# Patient Record
Sex: Male | Born: 1990 | Hispanic: Yes | Marital: Single | Smoking: Current every day smoker
Health system: Southern US, Community
[De-identification: ages and names within clinical notes are randomized; demographics above are authoritative.]

---
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
## 2010-08-16 ENCOUNTER — Emergency Department: Payer: Self-pay | Admitting: Emergency Medicine

## 2010-09-18 ENCOUNTER — Emergency Department: Payer: Self-pay | Admitting: Emergency Medicine

## 2011-03-04 LAB — LIPID PANEL
Cholesterol: 163 mg/dL (ref 0–169)
HDL: 26 mg/dL — ABNORMAL LOW (ref >34)
LDL Cholesterol: 111 mg/dL — ABNORMAL HIGH (ref 0–109)
Total CHOL/HDL Ratio: 6.3 RATIO
Triglycerides: 130 mg/dL (ref <150)

## 2011-03-04 LAB — BASIC METABOLIC PANEL
Chloride: 102 mEq/L (ref 96–112)
Potassium: 3.5 mEq/L (ref 3.5–5.1)

## 2011-03-04 LAB — HEPATIC FUNCTION PANEL
ALT: 18 U/L (ref 0–53)
AST: 16 U/L (ref 0–37)
Albumin: 3.7 g/dL (ref 3.5–5.2)
Total Protein: 7.1 g/dL (ref 6.0–8.3)

## 2011-03-04 LAB — GAMMA GT: GGT: 19 U/L (ref 7–51)

## 2011-04-15 IMAGING — CR RIGHT HAND - 2 VIEW
1 series · 2 of 2 positions shown · non-contrast
Comparison: none

REASON FOR EXAM: possible glass in laceration
COMMENTS:   May transport without cardiac monitor

PROCEDURE:     DXR - DXR RT HAND 2 VIEWS  - September 18, 2010  [DATE]
RESULT:     There is no evidence of fracture, dislocation, or malalignment.
There is no evidence of radiopaque foreign bodies.

[Series 1: view not recorded · 0.17mm/px · 2 of 2 slices shown]
[im 1/2]
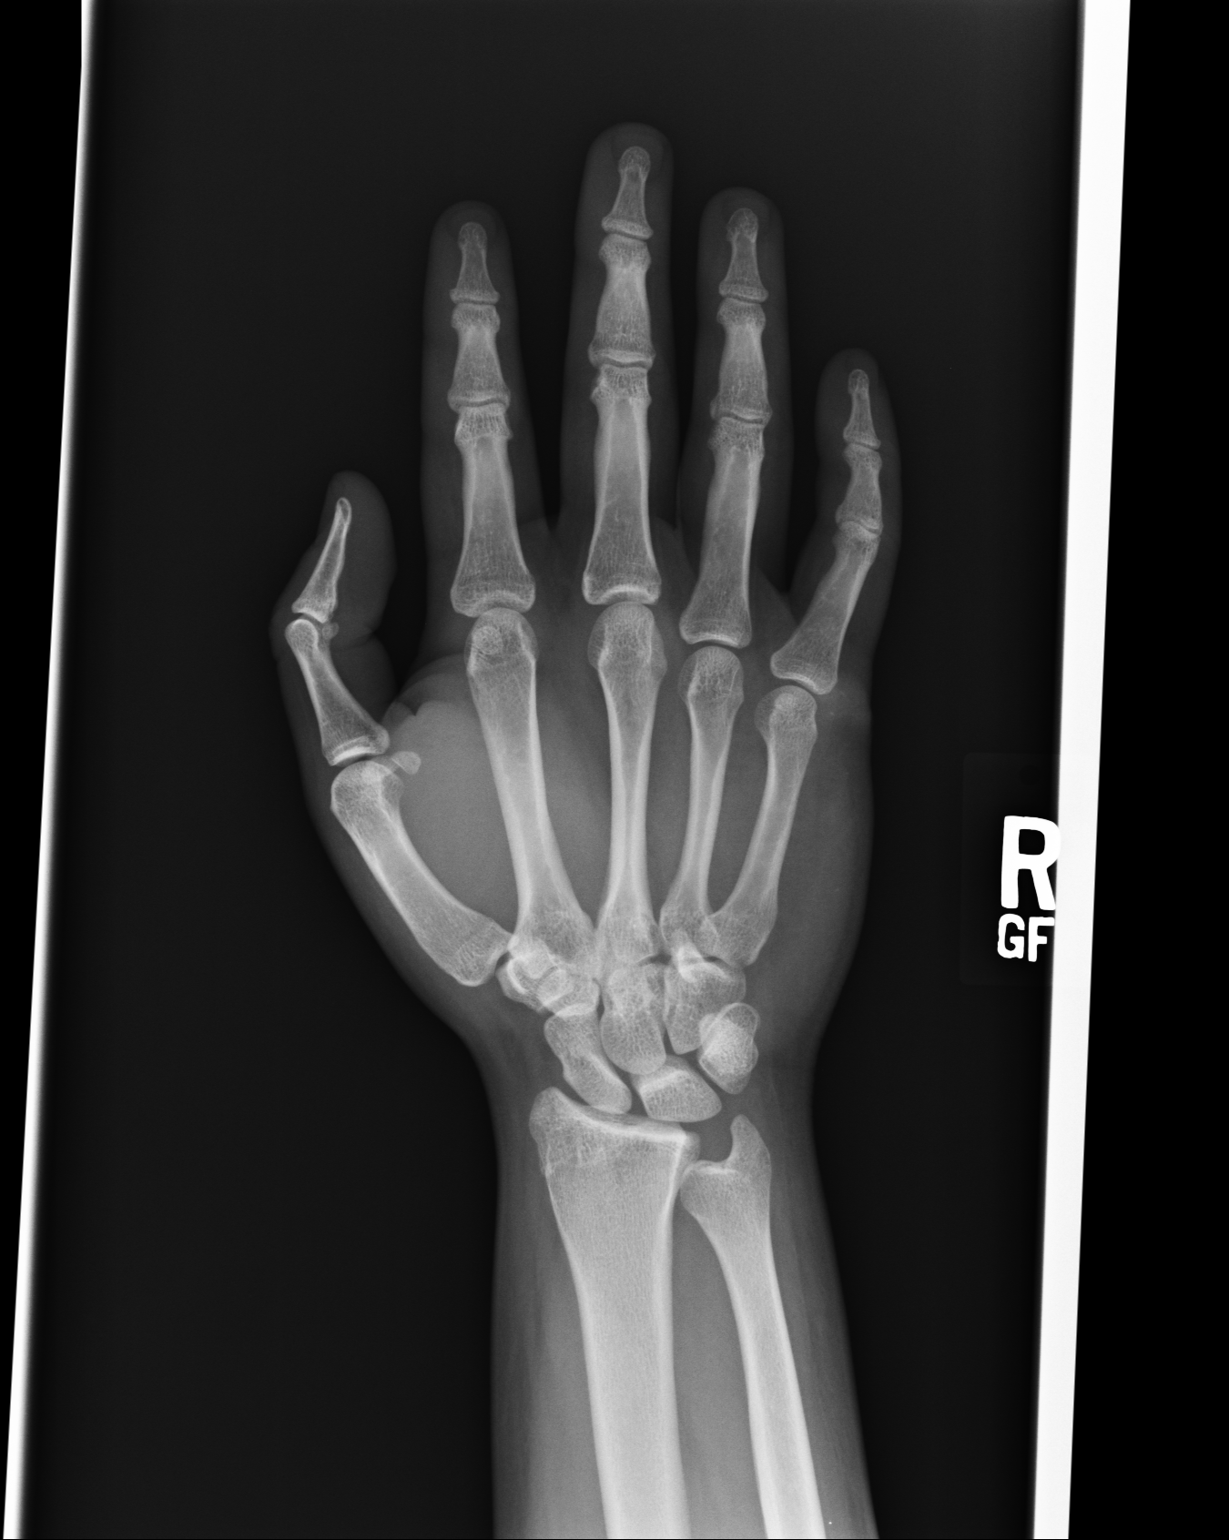
[im 2/2]
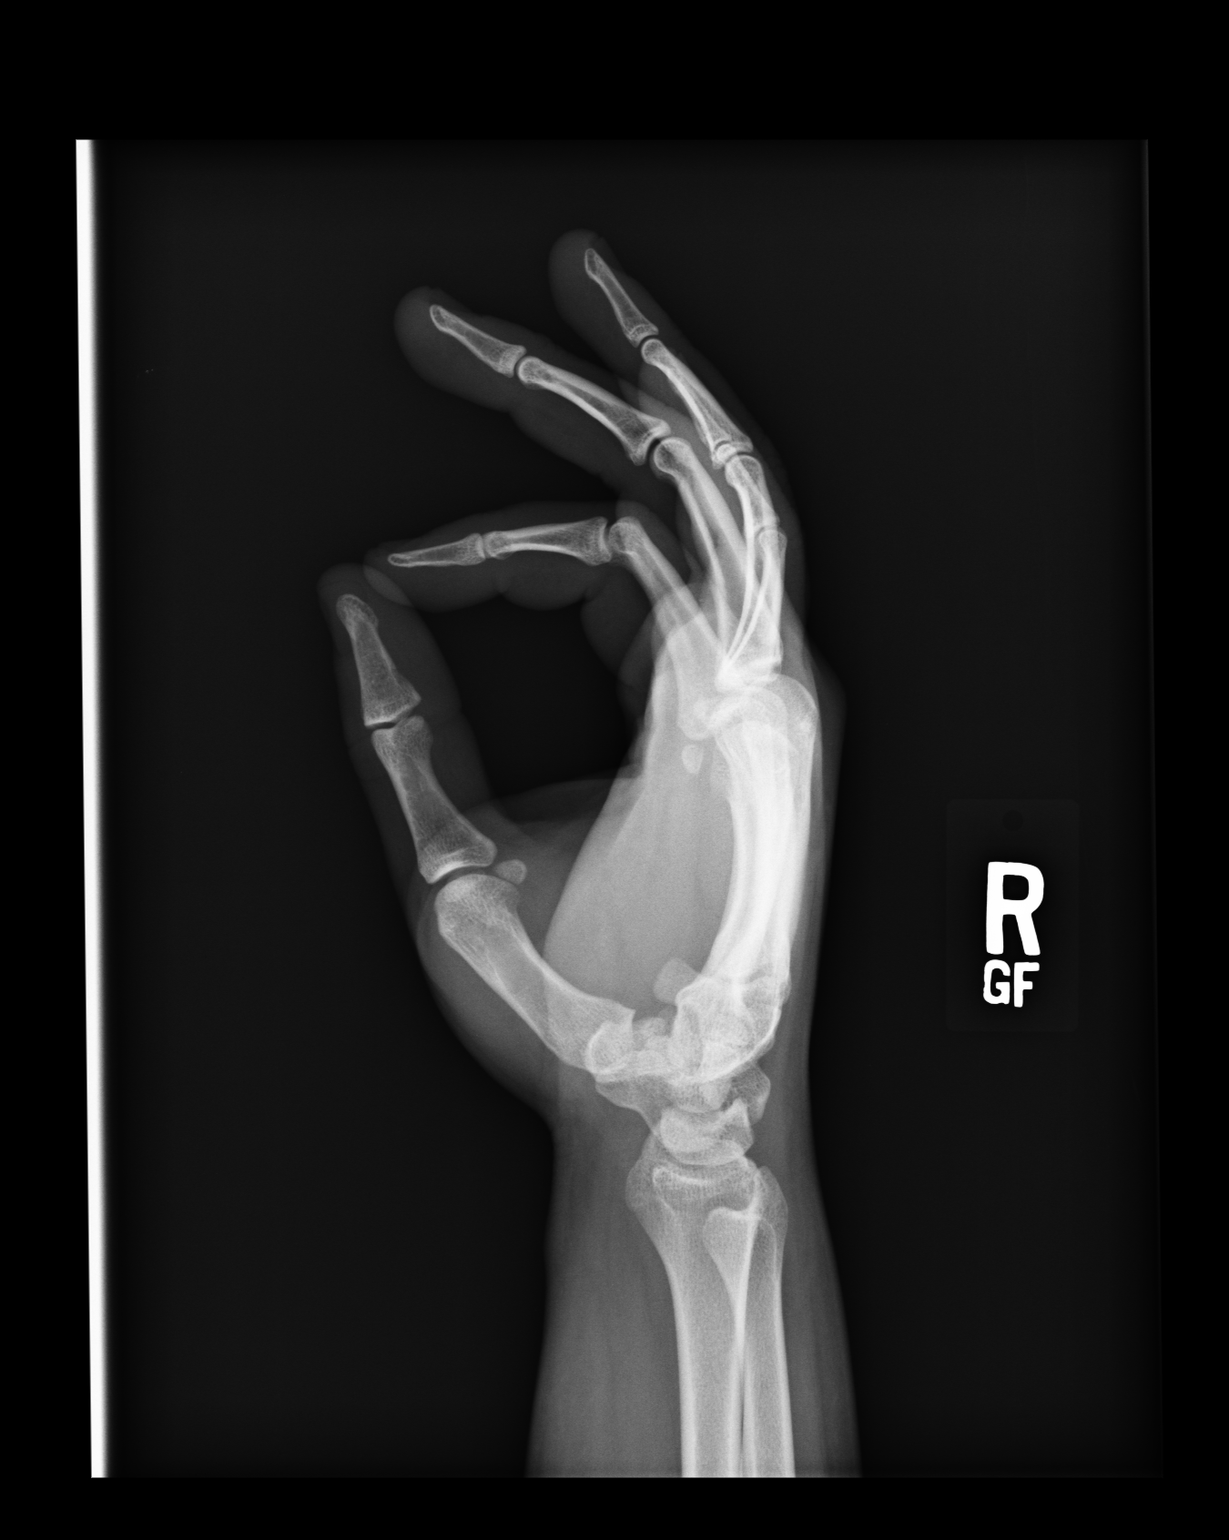

[2 of 2 positions shown; findings below may reference images not displayed]

IMPRESSION: 1. No evidence of acute abnormalities.
2. If there are persistent complaints of pain or persistent clinical
concern, a repeat evaluation in 7-10 days is recommended if clinically
warranted.

## 2011-09-07 ENCOUNTER — Emergency Department: Payer: Self-pay | Admitting: Emergency Medicine

## 2013-02-08 ENCOUNTER — Emergency Department: Payer: Self-pay | Admitting: Unknown Physician Specialty

## 2014-03-26 ENCOUNTER — Emergency Department: Payer: Self-pay | Admitting: Emergency Medicine

## 2014-03-26 LAB — URINALYSIS, COMPLETE
BACTERIA: NONE SEEN
BILIRUBIN, UR: NEGATIVE
BLOOD: NEGATIVE
GLUCOSE, UR: NEGATIVE mg/dL (ref 0–75)
Ketone: NEGATIVE
LEUKOCYTE ESTERASE: NEGATIVE
NITRITE: NEGATIVE
Ph: 6 (ref 4.5–8.0)
Protein: NEGATIVE
SPECIFIC GRAVITY: 1.024 (ref 1.003–1.030)
SQUAMOUS EPITHELIAL: NONE SEEN

## 2014-06-13 ENCOUNTER — Emergency Department: Payer: Self-pay | Admitting: Emergency Medicine

## 2014-06-13 LAB — COMPREHENSIVE METABOLIC PANEL
ALK PHOS: 58 U/L
Albumin: 3.8 g/dL (ref 3.4–5.0)
Anion Gap: 4 — ABNORMAL LOW (ref 7–16)
BILIRUBIN TOTAL: 0.6 mg/dL (ref 0.2–1.0)
BUN: 14 mg/dL (ref 7–18)
CALCIUM: 8.7 mg/dL (ref 8.5–10.1)
CHLORIDE: 106 mmol/L (ref 98–107)
CO2: 26 mmol/L (ref 21–32)
CREATININE: 0.84 mg/dL (ref 0.60–1.30)
EGFR (Non-African Amer.): >60
GLUCOSE: 91 mg/dL (ref 65–99)
Osmolality: 272 (ref 275–301)
Potassium: 4.6 mmol/L (ref 3.5–5.1)
SGOT(AST): 46 U/L — ABNORMAL HIGH (ref 15–37)
SGPT (ALT): 38 U/L (ref 12–78)
Sodium: 136 mmol/L (ref 136–145)
TOTAL PROTEIN: 7.7 g/dL (ref 6.4–8.2)

## 2014-06-13 LAB — CBC WITH DIFFERENTIAL/PLATELET
BASOS ABS: 0.1 10*3/uL (ref 0.0–0.1)
BASOS PCT: 1.1 %
EOS ABS: 0.3 10*3/uL (ref 0.0–0.7)
EOS PCT: 3.3 %
HCT: 47.7 % (ref 40.0–52.0)
HGB: 15.5 g/dL (ref 13.0–18.0)
LYMPHS PCT: 18.1 %
Lymphocyte #: 1.7 10*3/uL (ref 1.0–3.6)
MCH: 27.2 pg (ref 26.0–34.0)
MCHC: 32.5 g/dL (ref 32.0–36.0)
MCV: 84 fL (ref 80–100)
MONO ABS: 0.6 x10 3/mm (ref 0.2–1.0)
Monocyte %: 6.7 %
NEUTROS PCT: 70.8 %
Neutrophil #: 6.6 10*3/uL — ABNORMAL HIGH (ref 1.4–6.5)
PLATELETS: 226 10*3/uL (ref 150–440)
RBC: 5.69 10*6/uL (ref 4.40–5.90)
RDW: 15.4 % — ABNORMAL HIGH (ref 11.5–14.5)
WBC: 9.3 10*3/uL (ref 3.8–10.6)

## 2014-06-15 ENCOUNTER — Emergency Department: Payer: Self-pay | Admitting: Emergency Medicine

## 2015-01-08 IMAGING — CR DG KNEE COMPLETE 4+V*R*
1 series · 4 of 4 positions shown · non-contrast
Comparison: None.

CLINICAL DATA: Atraumatic left knee pain and swelling. And erythema

EXAM:
RIGHT KNEE - COMPLETE 4+ VIEW

[Series 1: ap · 0.17mm/px · 4 of 4 slices shown]
[im 1/4]
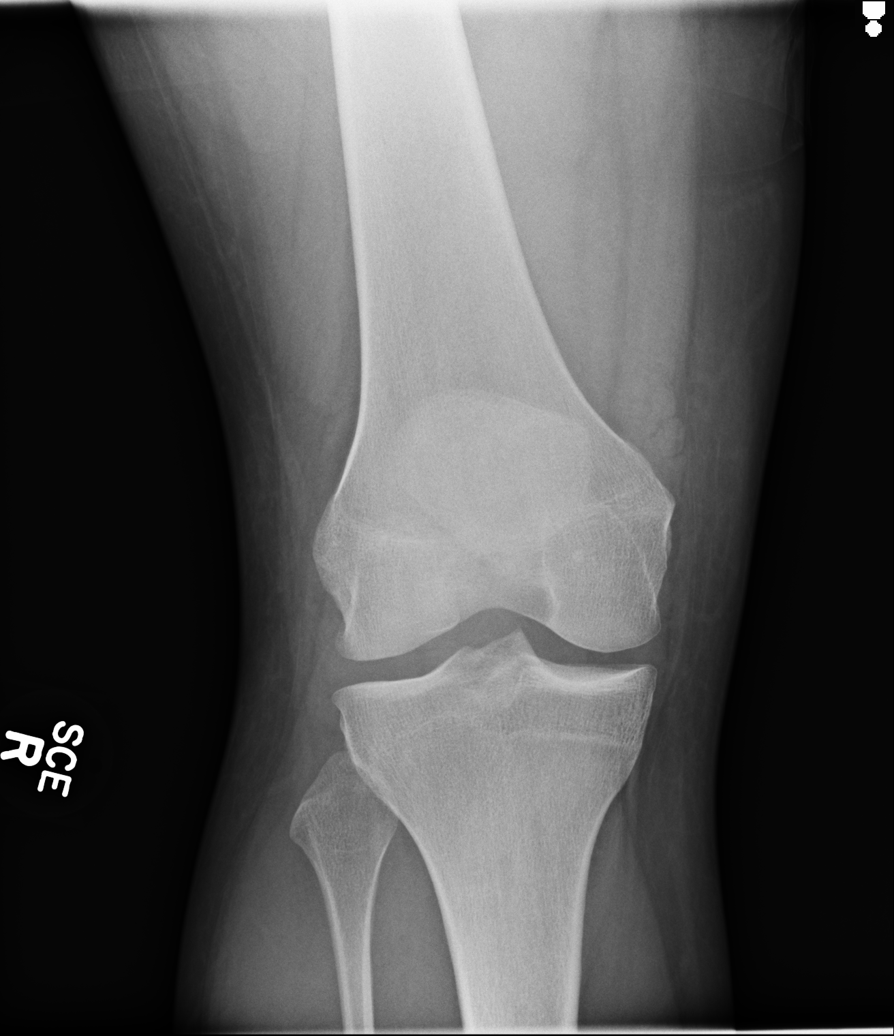
[im 2/4]
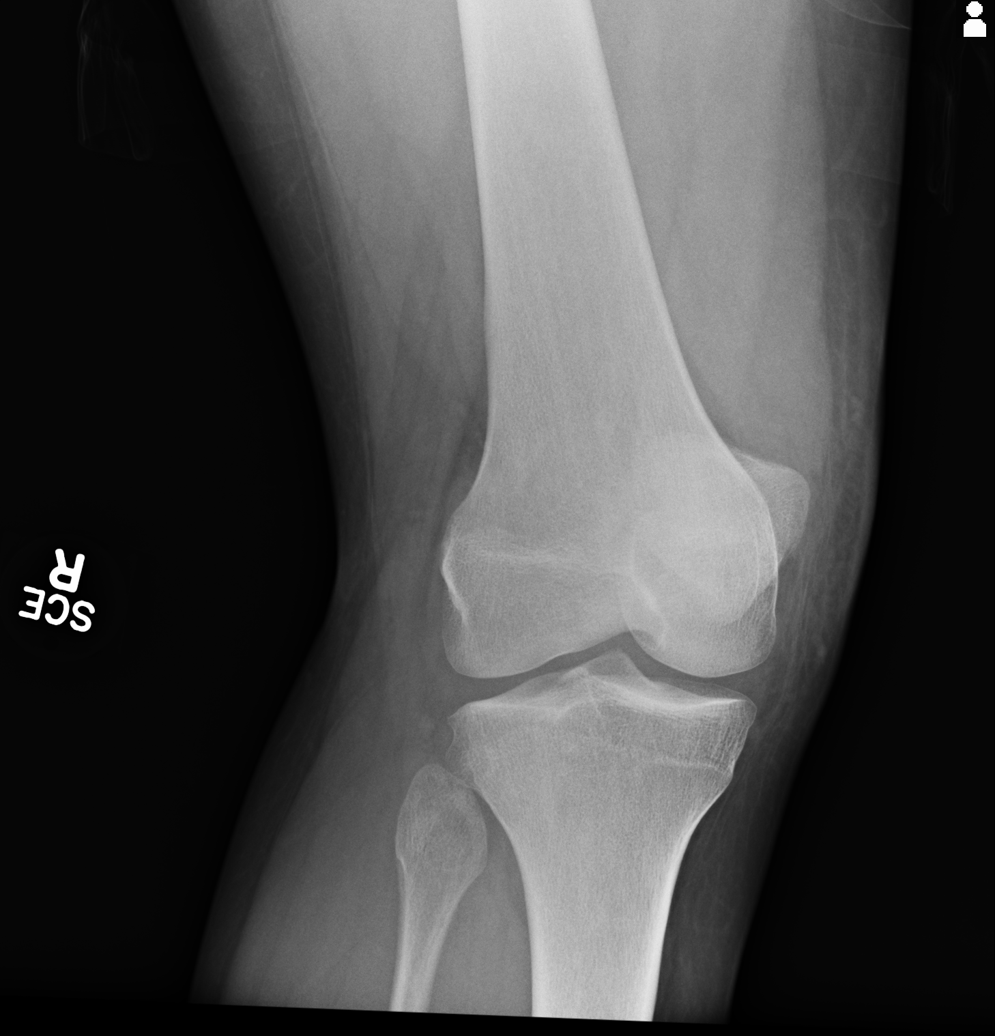
[im 3/4]
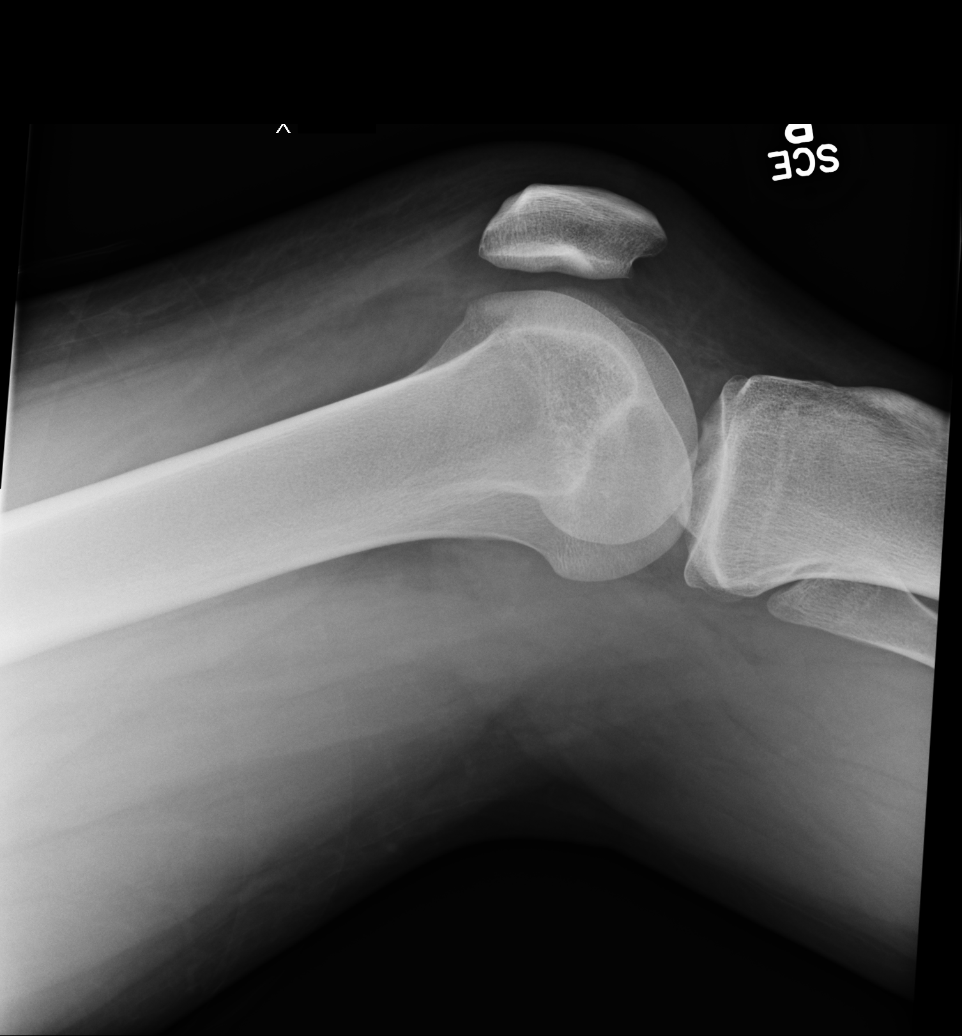
[im 4/4]
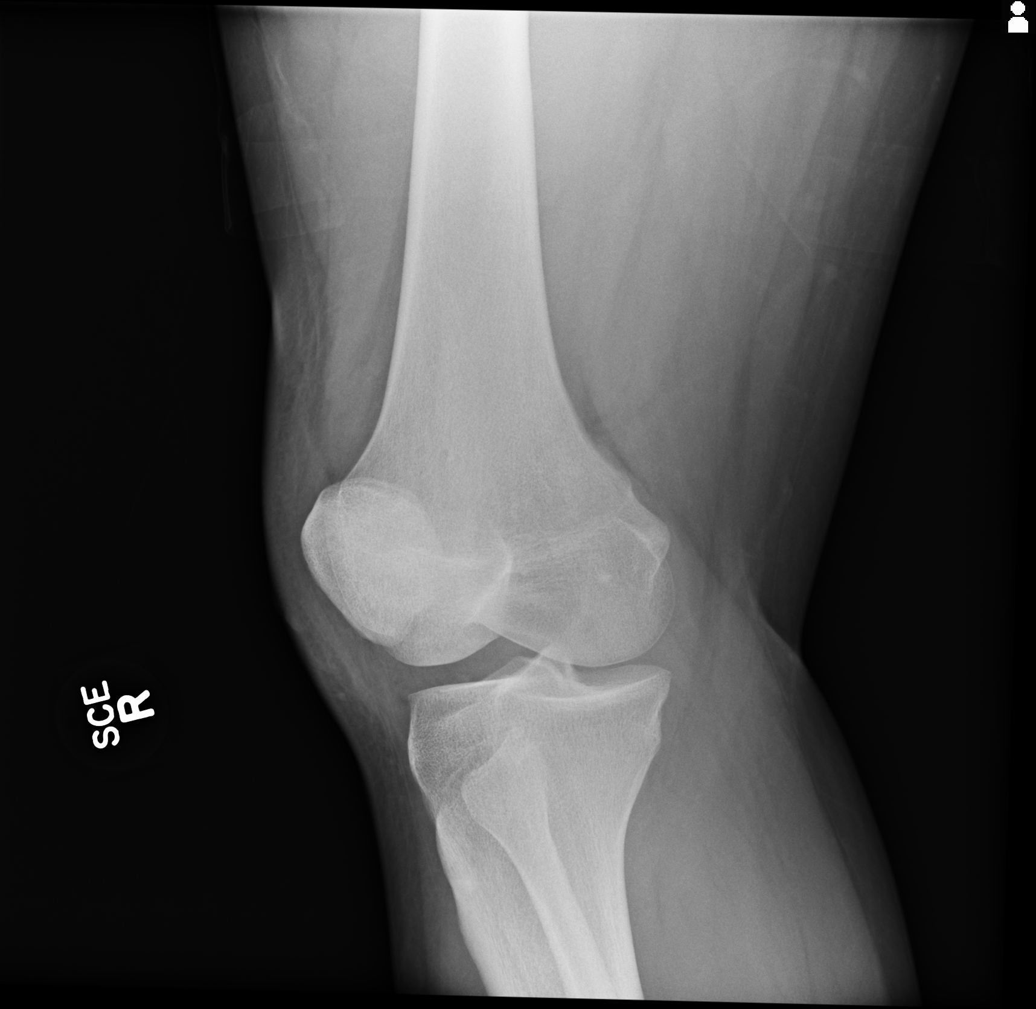

[4 of 4 positions shown; findings below may reference images not displayed]

FINDINGS: The bones are adequately mineralized. There is no fracture or
dislocation or significant degenerative change. There is no joint
effusion. There is mild prepatellar soft tissue swelling.
IMPRESSION: There is no acute bony abnormality of the right knee.

## 2017-05-05 ENCOUNTER — Encounter: Payer: Self-pay | Admitting: Emergency Medicine

## 2017-05-05 ENCOUNTER — Emergency Department
Admission: EM | Admit: 2017-05-05 | Discharge: 2017-05-05 | Disposition: A | Discharge status: Home / Self Care | Payer: Self-pay | Attending: Emergency Medicine | Admitting: Emergency Medicine

## 2017-05-05 DIAGNOSIS — H5789 Other specified disorders of eye and adnexa: Secondary | ICD-10-CM

## 2017-05-05 DIAGNOSIS — F17210 Nicotine dependence, cigarettes, uncomplicated: Secondary | ICD-10-CM | POA: Insufficient documentation

## 2017-05-05 DIAGNOSIS — H578 Other specified disorders of eye and adnexa: Secondary | ICD-10-CM | POA: Insufficient documentation

## 2017-05-05 MED ORDER — TETRACAINE HCL 0.5 % OP SOLN
2.0000 [drp] | Freq: Once | OPHTHALMIC | Status: AC
  Administered 2017-05-05: 2 [drp] via OPHTHALMIC
  Filled 2017-05-05: qty 2

## 2017-05-05 MED ORDER — FLUORESCEIN SODIUM 0.6 MG OP STRP
1.0000 | ORAL_STRIP | Freq: Once | OPHTHALMIC | Status: AC
  Administered 2017-05-05: 1 via OPHTHALMIC
  Filled 2017-05-05: qty 1

## 2017-05-05 NOTE — ED Notes (Signed)
See triage note   States he developed left eye pain with some drainage and swelling about 1 week ago  Now both eyes swollen and sensitive to light  Denies any trauma  Was recently given some amoxil and eye ointment by a friend

## 2017-05-05 NOTE — ED Triage Notes (Signed)
Pt reports eye pain since last week states difficulty opening eye and denies any injury. Pt wearing sunglasses in triage.

## 2017-05-05 NOTE — ED Provider Notes (Signed)
[facility] Emergency Department Provider Note  ____________________________________________  Time seen: Approximately 10:47 AM  I have reviewed the triage vital signs and the nursing notes.   HISTORY  Chief Complaint Eye Pain    HPI Garrett Ray is a 26 y.o. male that presents to the emergency department with bilateral eye redness, yellow discharge, photophobia, and pain for one week. He states that he is unable to keep his eyes open due to pain. It takes him to hours in the morning before he is able to open his eyes. He is wearing sunglasses everywhere because the light hurts his eyes. Patient has been putting Polytrim in his eyes 4 times a day for 4 days that he got from a friend. He came to the emergency department today because he feels like he is going blind. He does not wear contacts. He was glasses. No trauma to eyes. He works as a Psychologist, occupational. He denies fever, shortness of breath, chest pain, nausea, vomiting, abdominal pain.   History reviewed. No pertinent past medical history.  There are no active problems to display for this patient.   History reviewed. No pertinent surgical history.  Prior to Admission medications   Not on File    Allergies Patient has no known allergies.  History reviewed. No pertinent family history.  Social History Social History  Substance Use Topics  . Smoking status: Current Every Day Smoker    Packs/day: 1.00    Types: Cigarettes  . Smokeless tobacco: Never Used  . Alcohol use No     Review of Systems  Constitutional: No fever/chills Cardiovascular: No chest pain. Respiratory: No SOB. Gastrointestinal: No abdominal pain.  No nausea, no vomiting.  Musculoskeletal: Negative for musculoskeletal pain. Skin: Negative for rash, abrasions, lacerations, ecchymosis. Neurological: Negative for headaches, numbness or tingling   ____________________________________________   PHYSICAL EXAM:  VITAL SIGNS: ED  Triage Vitals [05/05/17 1017]  Enc Vitals Group     BP (!) 142/73     Pulse Rate 87     Resp 18     Temp 98.8 F (37.1 C)     Temp Source Oral     SpO2 99 %     Weight 210 lb (95.3 kg)     Height 5\' 10"  (1.778 m)     Head Circumference      Peak Flow      Pain Score 7     Pain Loc      Pain Edu?      Excl. in GC?      Constitutional: Alert and oriented. Well appearing and in no acute distress. Eyes: Conjunctivae are injected. PERRL. Extreme difficulty keeping his eyes open. Wearing sunglasses. Pain not relieved with tetracaine drops. No defect seen on fluorescein staining. Patient unable to keep eyes open for tonometry. No discharge. No erythema or swelling around eye. Head:  ENT:      Ears:      Nose: No congestion/rhinnorhea.      Mouth/Throat: Mucous membranes are moist.  Neck: No stridor.   Cardiovascular: Normal rate, regular rhythm.  Good peripheral circulation. Respiratory: Normal respiratory effort without tachypnea or retractions. Lungs CTAB. Good air entry to the bases with no decreased or absent breath sounds. Musculoskeletal: Full range of motion to all extremities. No gross deformities appreciated. Neurologic:  Normal speech and language. No gross focal neurologic deficits are appreciated.  Skin:  Skin is warm, dry and intact. No rash noted.   ____________________________________________   LABS (all labs  ordered are listed, but only abnormal results are displayed)  Labs Reviewed - No data to display ____________________________________________  EKG   ____________________________________________  RADIOLOGY  No results found.  ____________________________________________    PROCEDURES  Procedure(s) performed:    Procedures    Visual Acuity  Right Eye Distance: 20/20 Left Eye Distance: 20/25 Bilateral Distance: 20/20 (WITH GLASSES)  Right Eye Near:   Left Eye Near:    Bilateral Near:      Medications  tetracaine (PONTOCAINE) 0.5 %  ophthalmic solution 2 drop (2 drops Both Eyes Given 05/05/17 1136)  fluorescein ophthalmic strip 1 strip (1 strip Both Eyes Given 05/05/17 1136)  fluorescein ophthalmic strip 1 strip (1 strip Both Eyes Given 05/05/17 1136)     ____________________________________________   INITIAL IMPRESSION / ASSESSMENT AND PLAN / ED COURSE  Pertinent labs & imaging results that were available during my care of the patient were reviewed by me and considered in my medical decision making (see chart for details).  Review of the [place] CSRS was performed in accordance of the [facility] prior to dispensing any controlled drugs.   Patient presented to the emergency department with eye redness and photophobia for one week. No defect on fluorescein stain. Unable to get accurate pressures with tonometry due to patient not able to open eyes. Visual acuity 20/20 in right eye and 20/25 in left eye. Dr. Brooke DareKing was consulted and recommended that patient be evaluated in clinic at 1:30 today. Patient agreed to see optometry today at 1:30.  Patient is given ED precautions to return to the ED for any worsening or new symptoms.     ____________________________________________  FINAL CLINICAL IMPRESSION(S) / ED DIAGNOSES  Final diagnoses:  Red eye      NEW MEDICATIONS STARTED DURING THIS VISIT:  There are no discharge medications for this patient.       This chart was dictated using voice recognition software/Dragon. Despite best efforts to proofread, errors can occur which can change the meaning. Any change was purely unintentional.    Enid DerryWagner, Coreena Rubalcava, PA-C 05/05/17 1623    Jene EveryKinner, Robert, MD 05/09/17 1120

## 2017-05-05 NOTE — Discharge Instructions (Signed)
Please go to [facility] for appointment with Dr. Brooke DareKing at 1:30 PM.
